# Patient Record
Sex: Male | Born: 1980 | Race: White | Hispanic: No | Marital: Single | Smoking: Current every day smoker
Health system: Southern US, Community
[De-identification: ages and names within clinical notes are randomized; demographics above are authoritative.]

## PROBLEM LIST (undated history)

## (undated) DIAGNOSIS — M719 Bursopathy, unspecified: Secondary | ICD-10-CM

## (undated) HISTORY — DX: Bursopathy, unspecified: M71.9

---

## 2015-01-17 ENCOUNTER — Ambulatory Visit (INDEPENDENT_AMBULATORY_CARE_PROVIDER_SITE_OTHER): Payer: 59 | Admitting: Emergency Medicine

## 2015-01-17 ENCOUNTER — Ambulatory Visit (INDEPENDENT_AMBULATORY_CARE_PROVIDER_SITE_OTHER): Payer: 59

## 2015-01-17 VITALS — BP 124/80 | HR 82 | Temp 98.8°F | Resp 17 | Ht 74.5 in | Wt 268.0 lb

## 2015-01-17 DIAGNOSIS — S6992XA Unspecified injury of left wrist, hand and finger(s), initial encounter: Secondary | ICD-10-CM | POA: Diagnosis not present

## 2015-01-17 DIAGNOSIS — M79642 Pain in left hand: Secondary | ICD-10-CM | POA: Diagnosis not present

## 2015-01-17 NOTE — Progress Notes (Signed)
[facility] [address] [place] [address] [phone]  Date:  01/17/2015   Name:  Tyler Yang   DOB:  11/05/1980   MRN:  [id number]  PCP:  No primary care provider on file.    History of Present Illness:  Tyler Yang is a 34 y.o. male patient who presentds to [facility] today for chief complaint of left hand pain since yesterday. Patient was in the grocery store when he went to avoid a shopper and ran his hand into the freezer door.  The pain was immediate with tingling down his 4 fingers as well as swelling. He has used aspirin which he states has helped the pain and wrapped his hand. He has not used ice or any thermo-therapies.  He denies numbness or coolness to the extremity.  This is his non-dominant hand.  He had no previous to this hand, but has had fractures to middle finger and index finger.   There are no active problems to display for this patient.   History reviewed. No pertinent past medical history.  History reviewed. No pertinent past surgical history.  History  Substance Use Topics  . Smoking status: Current Every Day Smoker -- 0.20 packs/day for 13 years    Types: Cigarettes  . Smokeless tobacco: Not on file  . Alcohol Use: Not on file    History reviewed. No pertinent family history.  No Known Allergies  Medication list has been reviewed and updated.  No current outpatient prescriptions on file prior to visit.   No current facility-administered medications on file prior to visit.    ROS ROS otherwise unremarkable unless listed above.    Physical Examination: BP 124/80 mmHg  Pulse 82  Temp(Src) 98.8 F (37.1 C) (Oral)  Resp 17  Ht 6' 2.5" (1.892 m)  Wt 268 lb (121.564 kg)  BMI 33.96 kg/m2  SpO2 98% Ideal Body Weight: Weight in (lb) to have BMI = 25: 196.9  Physical Exam Alert, cooperative, oriented 4. Small red streak across the third metacarpal on the dorsum of his hand. There is mild swelling localized to this area  over the third M CPE along with tenderness to palpation. He has good range of motion and resistance strength in all digits with flexion and aB-and aD-duction. Good capillary refill good radial pulse.  Grip strength of left hand 4/5, right 5/5.     [facility] reading (PRIMARY) by  Dr. Dareen Piano: Normal   Assessment and Plan: 34 year old male is here for left hand pain.  XR is negative.  Advised to Ice, NSAIDs, and compression at this time.  Bandage placed to compress wound.  Advised to rtc if sxs do not improve after 1 week, or alarming sxs that warrant immediate return.  .   1. Left hand pain - DG Hand Complete Left  2. Hand injury, left, initial encounter  Trena Platt, PA-C [facility] 01/17/2015 3:56 PM

## 2015-01-17 NOTE — Patient Instructions (Addendum)
There is no fracture at this time that is prominent at this hand.   You have bruised this hand and will have some swelling and color changes along the hand. I would like you to ice the hand 3-4x per day for 15 minutes.  Please take ibuprofen or aleve for your pain and to cut down on the inflammation. If you continue to have non-improvement of this pain in a week, you should let me know.  If you experience hand or finger numbness, coolness, and increased pain, you should let us know.

## 2017-04-01 ENCOUNTER — Encounter: Payer: Self-pay | Admitting: Family Medicine

## 2017-04-01 ENCOUNTER — Ambulatory Visit (INDEPENDENT_AMBULATORY_CARE_PROVIDER_SITE_OTHER): Payer: Managed Care, Other (non HMO) | Admitting: Family Medicine

## 2017-04-01 VITALS — BP 138/85 | HR 79 | Temp 97.8°F | Resp 16 | Ht 75.0 in | Wt 298.0 lb

## 2017-04-01 DIAGNOSIS — Z131 Encounter for screening for diabetes mellitus: Secondary | ICD-10-CM | POA: Diagnosis not present

## 2017-04-01 DIAGNOSIS — F1290 Cannabis use, unspecified, uncomplicated: Secondary | ICD-10-CM | POA: Diagnosis not present

## 2017-04-01 DIAGNOSIS — M25512 Pain in left shoulder: Secondary | ICD-10-CM

## 2017-04-01 DIAGNOSIS — Z8659 Personal history of other mental and behavioral disorders: Secondary | ICD-10-CM

## 2017-04-01 DIAGNOSIS — M2550 Pain in unspecified joint: Secondary | ICD-10-CM | POA: Diagnosis not present

## 2017-04-01 DIAGNOSIS — Z13220 Encounter for screening for lipoid disorders: Secondary | ICD-10-CM | POA: Diagnosis not present

## 2017-04-01 DIAGNOSIS — M25511 Pain in right shoulder: Secondary | ICD-10-CM | POA: Diagnosis not present

## 2017-04-01 DIAGNOSIS — Z8739 Personal history of other diseases of the musculoskeletal system and connective tissue: Secondary | ICD-10-CM

## 2017-04-01 NOTE — Progress Notes (Signed)
Subjective:  By signing my name below, I, Stann Ore, attest that this documentation has been prepared under the direction and in the presence of Meredith Staggers, MD. Electronically Signed: Stann Ore, Scribe. 04/01/2017 , 1:17 PM .  Patient was seen in Room 25 .   Patient ID: Tyler Yang, male    DOB: November 08, 1980, 36 y.o.   MRN: [id number] Chief Complaint  Patient presents with  . Establish Care   HPI Tyler Yang is a 36 y.o. male  Patient is here to establish care with me; he is a new patient to me. He had toast, french fries and coffee this morning.   Shoulder Pain Patient reports he hasn't seen a doctor in a long time and going through process of VA disability claim; hard to track things down. He's been having problems now with exacerbated bursitis, with multiple joints feeling sore, mostly worst in his shoulders. 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 He was treated with NSAIDs for his shoulders initially, and received cortisone injections in the past.   His last injection was 4 years ago in [place]. 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 He's also had dislocation in his right shoulder, last while on [place]rope bridge. He hasn't had formal physical therapy done. He informs his right shoulder is worse than his left shoulder. Reports at least 2-3 dislocation/relocation's of his right shoulder.  Hands/wrists He notes hands and wrists being more sore lately. He does pop and crack his fingers often. He's right hand dominant. No joint swelling  Feet He also reports having other aches and pains in his feet, as he's on his feet all day everyday. He was informed of having plantar fascitis. He denies history of gout. 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 No problems with his shoulders or back with baseball.   Back pain He denies incontinence with his back pain. He denies unexplained night sweats, fever, or weight loss.  It sided pain in his lower back on the sides. No saddle anesthesia, leg weakness.  Family history He informs rheumatoid arthritis running in his family; his maternal grandfather had "awful RA, with bent fingers".   Immunizations/health maintenance He states tetanus within last 10 years.  He declines flu shot today.  No recent testing for diabetes/hyperlipidemia.  PTSD He was diagnosed at outgoing counseling service in the [employer] about 9 years ago. He has difficulty sleeping, being in large crowds, and paranoia with people being behind him. He also has a need to know where the exits/entrances are. He also has nightmares and wake up in sweats. He's been self medicating by smoking marijuana everyday, which helps him sleep. He drinks alcohol socially, about 10 drinks a week, but varies. He notes occasionally having a drink after work as well. He denies history of DUI or problems with alcohol in the past.   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   There are no active problems to display for this patient.  Past Medical History:  Diagnosis Date  . Bursitis    History reviewed. No pertinent surgical history. No Known Allergies Prior to Admission medications   Medication Sig Start Date  End Date Taking? Authorizing Provider  loratadine (CLARITIN) 10 MG tablet Take 10 mg by mouth daily.   Yes [provider]   Social History   Social History  . Marital status: Single    Spouse name: N/A  . Number of children: N/A  . Years of education: N/A   Occupational History  . Not on file.   Social History Main Topics  . Smoking status: Current Every Day Smoker    Packs/day: 0.25    Years: 16.00    Types: Cigarettes  . Smokeless tobacco: Former  Neurosurgeon    Types: Chew  . Alcohol use 6.0 oz/week    10 Shots of liquor per week  . Drug use: Yes    Types: Marijuana  . Sexual activity: Not on file   Other Topics Concern  . Not on file   Social History Narrative  . No narrative on file   Review of Systems  Constitutional: Negative for fatigue and unexpected weight change.  Eyes: Negative for visual disturbance.  Respiratory: Negative for cough, chest tightness and shortness of breath.   Cardiovascular: Negative for chest pain, palpitations and leg swelling.  Gastrointestinal: Negative for abdominal pain and blood in stool.  Musculoskeletal: Positive for arthralgias and back pain.  Skin: Negative for rash and wound.  Neurological: Negative for dizziness, light-headedness and headaches.       Objective:   Physical Exam  Constitutional: He is oriented to person, place, and time. He appears well-developed and well-nourished. No distress.  HENT:  Head: Normocephalic and atraumatic.  Eyes: Pupils are equal, round, and reactive to light. EOM are normal.  Neck: Neck supple.  Cardiovascular: Normal rate.   Pulmonary/Chest: Effort normal. No respiratory distress.  Musculoskeletal: Normal range of motion.  No apparent swelling of lower extremities Knees: full ROM Ankles: full ROM Fingers: intact ROM Wrists: tight sensation with wrist ROM, but full; no joint swelling of wrists and fingers Shoulders: left shoulder clunk into internal rotation but equal ROM; negative lift off, negative empty can, full RTC strength; positive Jobe bilaterally  Left shoulder: positive apprehension, minimal pain with Hawkins; mild discomfort with Neer Right shoulder: discomfort with apprehension testing, positive O'brien's; negative Neer Back: L-spine flexion to 90 degrees, equal lateral flexion, intact rotation; no midline bony tenderness, describes paraspinal pain Able to heel-toe walk without difficulty  Neurological: He is alert and oriented to  person, place, and time.  Skin: Skin is warm and dry.  Psychiatric: He has a normal mood and affect. His behavior is normal.  Nursing note and vitals reviewed.   Vitals:   04/01/17 1158  BP: 138/85  Pulse: 79  Resp: 16  Temp: 97.8 F (36.6 C)  TempSrc: Oral  SpO2: 97%  Weight: 298 lb (135.2 kg)  Height: 6\' 3"  (1.905 m)      Assessment & Plan:   Tyler Yang is a 36 y.o. male History of dislocation of shoulder - Plan: AMB referral to orthopedics Pain of both shoulder joints - Plan: AMB referral to orthopedics  Reported multiple prior shoulder dislocations, with persistent right greater than left shoulder pain. Also reports previous bursitis with mild bursitis testing on exam today. No apparent rotator cuff weakness.  -Refer to orthopedic shoulder specialist to evaluate current status of his shoulder pain, and to determine future plan as far as physical therapy, injection, or need for surgery if repeated dislocation/instability.  History of posttraumatic stress disorder (PTSD) - Plan: Ambulatory referral to Psychiatry Marijuana use  -  Overall appears to be managing with stress management and awareness of surroundings. He has been also smoking marijuana daily to help with his current symptoms.   - refer to psychiatry to evaluate PTSD further and to determine if other treatments are needed.  Polyarthralgia - Plan: Rheumatoid factor, Sedimentation rate, Uric Acid  - Check RF, sedimentation rate, uric acid, but less likely inflammatory arthropathy based on history.  - Recommended repeat appointment to further discuss foot pain and back pain, but on initial exam today likely mechanical low back pain. No red flags on history/exam at present.  Screening for hyperlipidemia - Plan: Lipid panel  Screening for diabetes mellitus - Plan: Comprehensive metabolic panel  Patient Instructions    I will check some blood work to look into common causes of inflammatory arthritis such as  rheumatoid arthritis. We can discuss those tests further at follow-up in 2 weeks.  I placed a referral to psychiatry to discuss possible PTSD evaluation or recommendations on treatment.  I referred you to orthopedics to evaluate your shoulder pains and history of dislocations to determine next step.  Diabetes, cholesterol screening was performed today. Can discuss those results further in 2 weeks, but you will likely receive those results sooner. Sign-up for mychart for the quickest results.  At future visit we can discuss back pain and foot pain further.   IF you received an x-ray today, you will receive an invoice from [facility]. Please contact [facility] at [phone] with questions or concerns regarding your invoice.   IF you received labwork today, you will receive an invoice from [facility]. Please contact [facility] at [phone] with questions or concerns regarding your invoice.   Our billing staff will not be able to assist you with questions regarding bills from these companies.  You will be contacted with the lab results as soon as they are available. The fastest way to get your results is to activate your My Chart account. Instructions are located on the last page of this paperwork. If you have not heard from [place] regarding the results in 2 weeks, please contact this office.       I personally performed the services described in this documentation, which was scribed in my presence. The recorded information has been reviewed and considered for accuracy and completeness, addended by me as needed, and agree with information above.  Signed,   Meredith Staggers, MD [facility].  04/01/17 3:01 PM

## 2017-04-01 NOTE — Patient Instructions (Addendum)
  I will check some blood work to look into common causes of inflammatory arthritis such as rheumatoid arthritis. We can discuss those tests further at follow-up in 2 weeks.  I placed a referral to psychiatry to discuss possible PTSD evaluation or recommendations on treatment.  I referred you to orthopedics to evaluate your shoulder pains and history of dislocations to determine next step.  Diabetes, cholesterol screening was performed today. Can discuss those results further in 2 weeks, but you will likely receive those results sooner. Sign-up for mychart for the quickest results.  At future visit we can discuss back pain and foot pain further.   IF you received an x-ray today, you will receive an invoice from [facility]. Please contact [facility] at [phone] with questions or concerns regarding your invoice.   IF you received labwork today, you will receive an invoice from [facility]. Please contact [facility] at [phone] with questions or concerns regarding your invoice.   Our billing staff will not be able to assist you with questions regarding bills from these companies.  You will be contacted with the lab results as soon as they are available. The fastest way to get your results is to activate your My Chart account. Instructions are located on the last page of this paperwork. If you have not heard from [place] regarding the results in 2 weeks, please contact this office.

## 2017-04-02 LAB — COMPREHENSIVE METABOLIC PANEL
A/G RATIO: 1.9 (ref 1.2–2.2)
ALBUMIN: 4.8 g/dL (ref 3.5–5.5)
ALT: 51 IU/L — ABNORMAL HIGH (ref 0–44)
AST: 32 IU/L (ref 0–40)
Alkaline Phosphatase: 108 IU/L (ref 39–117)
BUN / CREAT RATIO: 18 (ref 9–20)
BUN: 16 mg/dL (ref 6–20)
Bilirubin Total: 0.7 mg/dL (ref 0.0–1.2)
CHLORIDE: 100 mmol/L (ref 96–106)
CO2: 21 mmol/L (ref 20–29)
Calcium: 9.5 mg/dL (ref 8.7–10.2)
Creatinine, Ser: 0.89 mg/dL (ref 0.76–1.27)
GFR, EST AFRICAN AMERICAN: 129 mL/min/{1.73_m2} (ref >59)
GFR, EST NON AFRICAN AMERICAN: 112 mL/min/{1.73_m2} (ref >59)
GLUCOSE: 83 mg/dL (ref 65–99)
Globulin, Total: 2.5 g/dL (ref 1.5–4.5)
Potassium: 4.2 mmol/L (ref 3.5–5.2)
Sodium: 140 mmol/L (ref 134–144)
TOTAL PROTEIN: 7.3 g/dL (ref 6.0–8.5)

## 2017-04-02 LAB — URIC ACID: Uric Acid: 6.2 mg/dL (ref 3.7–8.6)

## 2017-04-02 LAB — LIPID PANEL
CHOL/HDL RATIO: 5.7 ratio — AB (ref 0.0–5.0)
Cholesterol, Total: 211 mg/dL — ABNORMAL HIGH (ref 100–199)
HDL: 37 mg/dL — ABNORMAL LOW (ref >39)
Triglycerides: 551 mg/dL (ref 0–149)

## 2017-04-02 LAB — RHEUMATOID FACTOR: RHEUMATOID FACTOR: 10.4 [IU]/mL (ref 0.0–13.9)

## 2017-04-02 LAB — SEDIMENTATION RATE: Sed Rate: 12 mm/hr (ref 0–15)

## 2017-04-07 ENCOUNTER — Other Ambulatory Visit: Payer: Self-pay | Admitting: Family Medicine

## 2017-04-07 DIAGNOSIS — R945 Abnormal results of liver function studies: Principal | ICD-10-CM

## 2017-04-07 DIAGNOSIS — R7989 Other specified abnormal findings of blood chemistry: Secondary | ICD-10-CM

## 2017-04-07 DIAGNOSIS — E781 Pure hyperglyceridemia: Secondary | ICD-10-CM

## 2017-04-07 NOTE — Progress Notes (Signed)
See lab notes. Plan for fasting repeat testing

## 2017-04-15 ENCOUNTER — Ambulatory Visit: Payer: Managed Care, Other (non HMO) | Admitting: Family Medicine

## 2020-07-19 ENCOUNTER — Other Ambulatory Visit: Payer: Self-pay | Admitting: Obstetrics and Gynecology

## 2020-07-19 ENCOUNTER — Ambulatory Visit
Admission: RE | Admit: 2020-07-19 | Discharge: 2020-07-19 | Disposition: A | Discharge status: Home / Self Care | Payer: No Typology Code available for payment source | Source: Ambulatory Visit | Attending: Obstetrics and Gynecology | Admitting: Obstetrics and Gynecology

## 2020-07-19 ENCOUNTER — Other Ambulatory Visit: Payer: Self-pay

## 2020-07-19 DIAGNOSIS — Z111 Encounter for screening for respiratory tuberculosis: Secondary | ICD-10-CM

## 2022-04-09 IMAGING — CR DG CHEST 2V
3 series · 3 of 3 positions shown · non-contrast
Comparison: None.

CLINICAL DATA: Positive PPD

EXAM:
CHEST - 2 VIEW

[w chest pa]
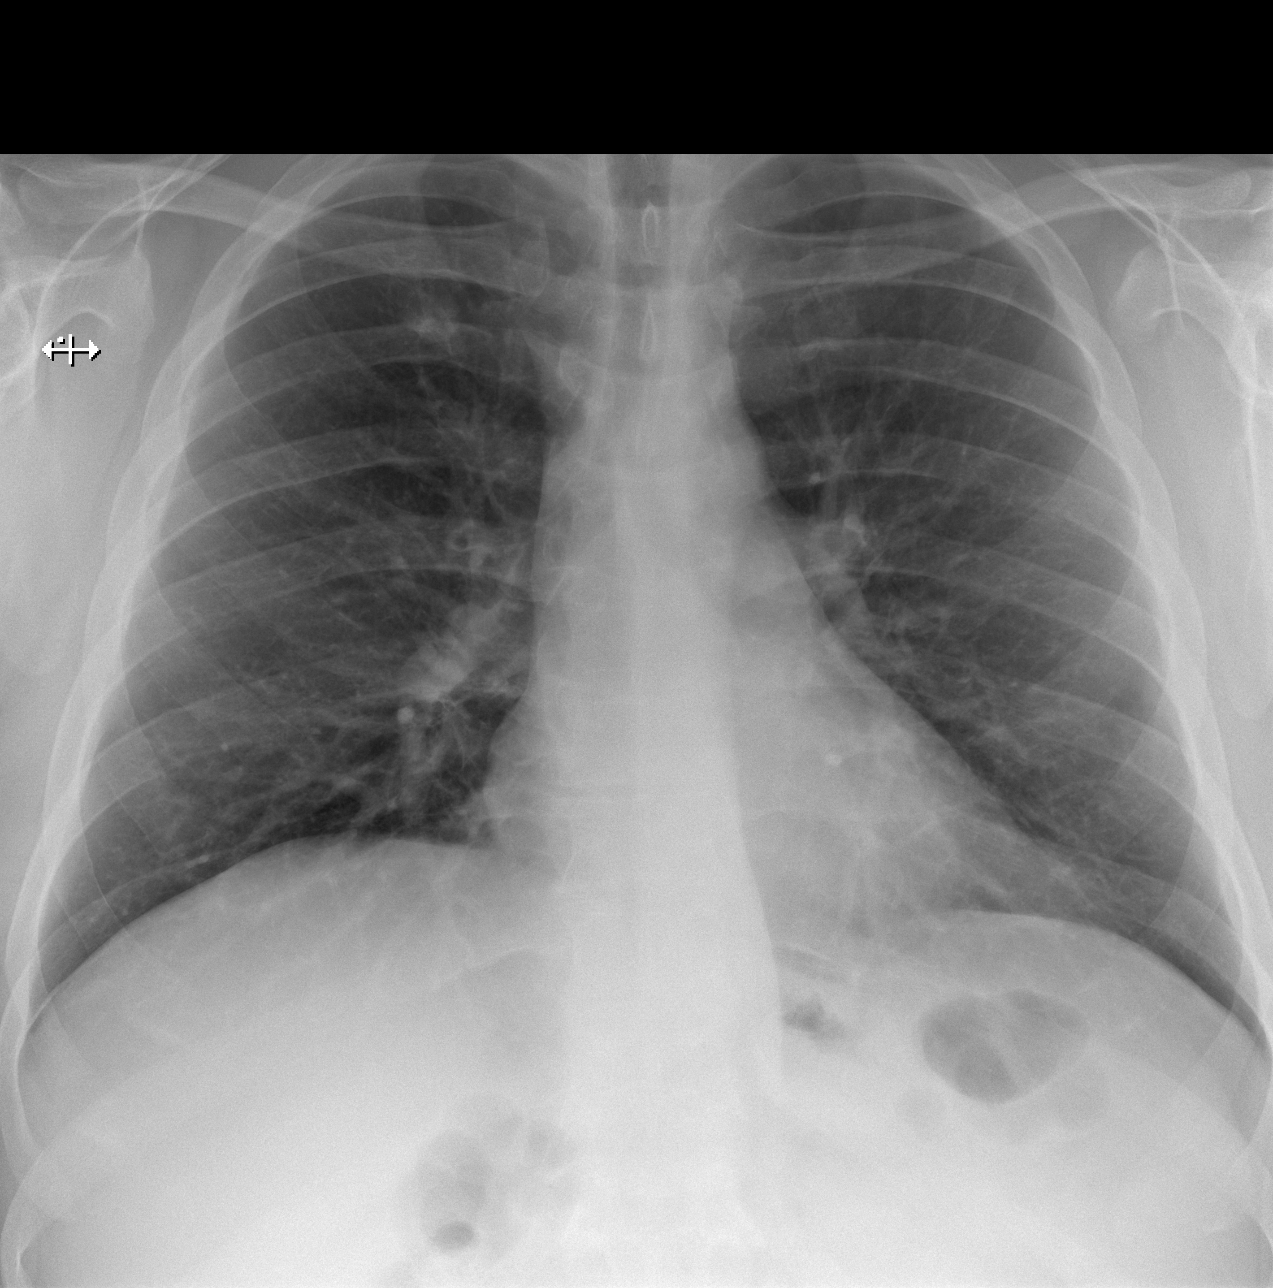

[w chest lat (1 of 2)]
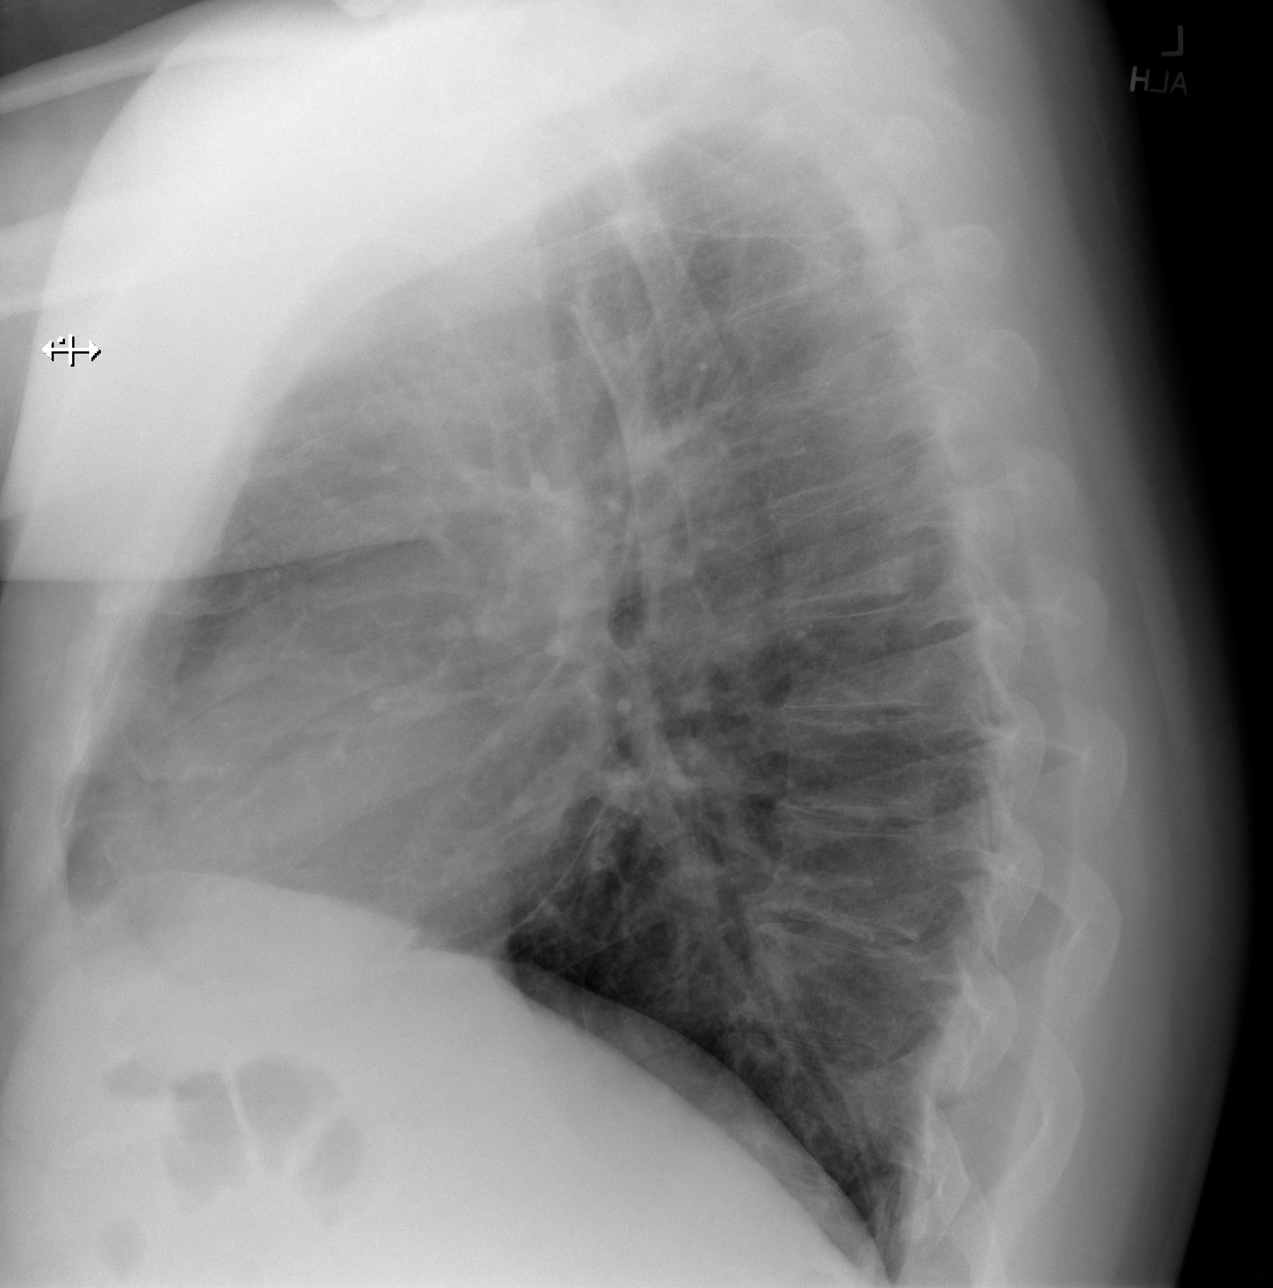

[w chest lat (2 of 2)]
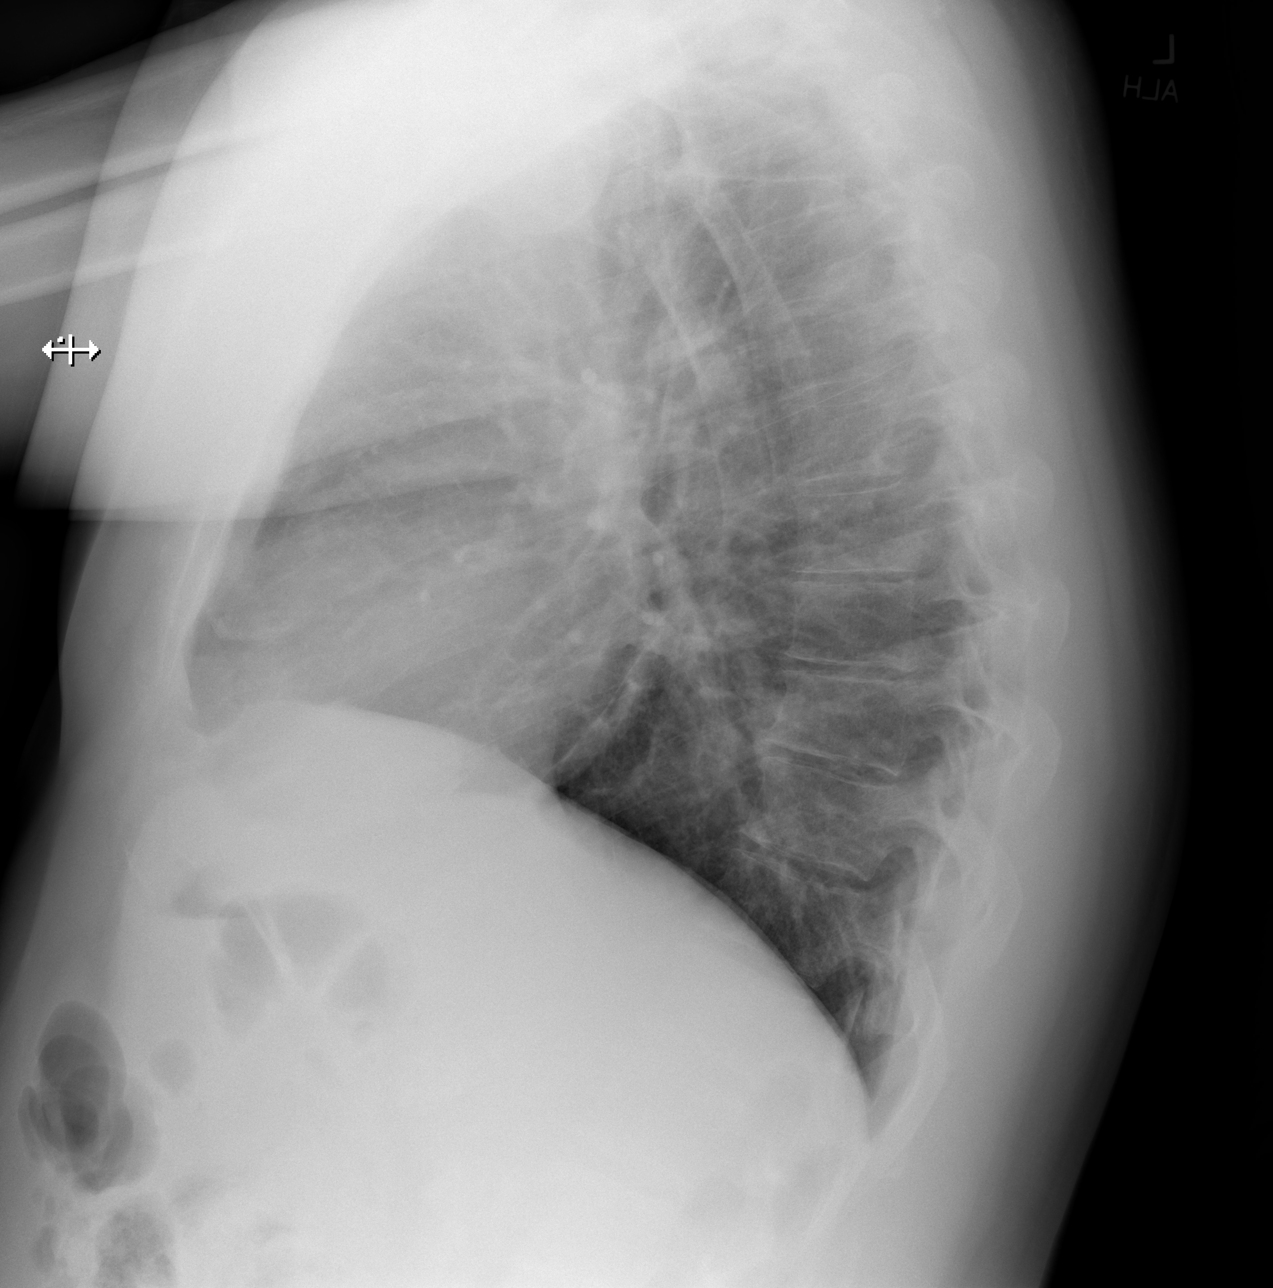

[3 of 3 positions shown; findings below may reference images not displayed]

FINDINGS: The heart size and mediastinal contours are within normal limits.
Both lungs are clear. The visualized skeletal structures are
unremarkable.
IMPRESSION: No active cardiopulmonary disease.
